# Patient Record
Sex: Male | Born: 1975 | Race: Black or African American | Hispanic: No | Marital: Single | Smoking: Current every day smoker
Health system: Southern US, Community
[De-identification: ages and names within clinical notes are randomized; demographics above are authoritative.]

## PROBLEM LIST (undated history)

## (undated) DIAGNOSIS — Z789 Other specified health status: Secondary | ICD-10-CM

## (undated) HISTORY — PX: NO PAST SURGERIES: SHX2092

---

## 2018-02-15 ENCOUNTER — Other Ambulatory Visit: Payer: Self-pay

## 2018-02-15 ENCOUNTER — Inpatient Hospital Stay
Admission: EM | Admit: 2018-02-15 | Discharge: 2018-02-17 | DRG: 935 | Disposition: A | Discharge status: Home / Self Care | Payer: Self-pay | Attending: Internal Medicine | Admitting: Internal Medicine

## 2018-02-15 ENCOUNTER — Emergency Department: Payer: Self-pay

## 2018-02-15 DIAGNOSIS — X19XXXA Contact with other heat and hot substances, initial encounter: Secondary | ICD-10-CM | POA: Diagnosis present

## 2018-02-15 DIAGNOSIS — T25229A Burn of second degree of unspecified foot, initial encounter: Secondary | ICD-10-CM | POA: Diagnosis present

## 2018-02-15 DIAGNOSIS — L0390 Cellulitis, unspecified: Secondary | ICD-10-CM | POA: Diagnosis present

## 2018-02-15 DIAGNOSIS — R030 Elevated blood-pressure reading, without diagnosis of hypertension: Secondary | ICD-10-CM | POA: Diagnosis present

## 2018-02-15 DIAGNOSIS — T25222A Burn of second degree of left foot, initial encounter: Principal | ICD-10-CM | POA: Diagnosis present

## 2018-02-15 DIAGNOSIS — L03116 Cellulitis of left lower limb: Secondary | ICD-10-CM | POA: Diagnosis present

## 2018-02-15 DIAGNOSIS — R7303 Prediabetes: Secondary | ICD-10-CM | POA: Diagnosis present

## 2018-02-15 DIAGNOSIS — F17210 Nicotine dependence, cigarettes, uncomplicated: Secondary | ICD-10-CM | POA: Diagnosis present

## 2018-02-15 HISTORY — DX: Other specified health status: Z78.9

## 2018-02-15 LAB — CBC WITH DIFFERENTIAL/PLATELET
BASOS ABS: 0.1 10*3/uL (ref 0–0.1)
BASOS PCT: 1 %
EOS ABS: 0 10*3/uL (ref 0–0.7)
Eosinophils Relative: 0 %
HEMATOCRIT: 44.7 % (ref 40.0–52.0)
HEMOGLOBIN: 15.3 g/dL (ref 13.0–18.0)
Lymphocytes Relative: 11 %
Lymphs Abs: 1.1 10*3/uL (ref 1.0–3.6)
MCH: 32.3 pg (ref 26.0–34.0)
MCHC: 34.2 g/dL (ref 32.0–36.0)
MCV: 94.5 fL (ref 80.0–100.0)
Monocytes Absolute: 1 10*3/uL (ref 0.2–1.0)
Monocytes Relative: 10 %
NEUTROS ABS: 7.8 10*3/uL — AB (ref 1.4–6.5)
NEUTROS PCT: 78 %
Platelets: 272 10*3/uL (ref 150–440)
RBC: 4.73 MIL/uL (ref 4.40–5.90)
RDW: 17.4 % — ABNORMAL HIGH (ref 11.5–14.5)
WBC: 10 10*3/uL (ref 3.8–10.6)

## 2018-02-15 LAB — COMPREHENSIVE METABOLIC PANEL
ALBUMIN: 4.4 g/dL (ref 3.5–5.0)
ALT: 40 U/L (ref 0–44)
AST: 34 U/L (ref 15–41)
Alkaline Phosphatase: 92 U/L (ref 38–126)
Anion gap: 11 (ref 5–15)
BILIRUBIN TOTAL: 0.5 mg/dL (ref 0.3–1.2)
BUN: 11 mg/dL (ref 6–20)
CALCIUM: 9.3 mg/dL (ref 8.9–10.3)
CO2: 25 mmol/L (ref 22–32)
Chloride: 104 mmol/L (ref 98–111)
Creatinine, Ser: 1.02 mg/dL (ref 0.61–1.24)
GLUCOSE: 118 mg/dL — AB (ref 70–99)
POTASSIUM: 3.5 mmol/L (ref 3.5–5.1)
Sodium: 140 mmol/L (ref 135–145)
TOTAL PROTEIN: 8.1 g/dL (ref 6.5–8.1)

## 2018-02-15 LAB — PROTIME-INR
INR: 0.92
PROTHROMBIN TIME: 12.3 s (ref 11.4–15.2)

## 2018-02-15 LAB — LACTIC ACID, PLASMA: LACTIC ACID, VENOUS: 1.6 mmol/L (ref 0.5–1.9)

## 2018-02-15 MED ORDER — VANCOMYCIN HCL IN DEXTROSE 1-5 GM/200ML-% IV SOLN
1000.0000 mg | Freq: Three times a day (TID) | INTRAVENOUS | Status: DC
  Administered 2018-02-16: 1000 mg via INTRAVENOUS
  Filled 2018-02-15 (×4): qty 200

## 2018-02-15 MED ORDER — SODIUM CHLORIDE 0.9 % IV BOLUS
1000.0000 mL | Freq: Once | INTRAVENOUS | Status: AC
  Administered 2018-02-15: 1000 mL via INTRAVENOUS

## 2018-02-15 MED ORDER — SODIUM CHLORIDE 0.9 % IV SOLN
2.0000 g | INTRAVENOUS | Status: DC
  Administered 2018-02-16: 06:00:00 2 g via INTRAVENOUS
  Filled 2018-02-15: qty 2

## 2018-02-15 MED ORDER — PIPERACILLIN-TAZOBACTAM 3.375 G IVPB 30 MIN
3.3750 g | Freq: Once | INTRAVENOUS | Status: AC
  Administered 2018-02-15: 3.375 g via INTRAVENOUS
  Filled 2018-02-15: qty 50

## 2018-02-15 MED ORDER — MORPHINE SULFATE (PF) 4 MG/ML IV SOLN
4.0000 mg | Freq: Once | INTRAVENOUS | Status: AC
  Administered 2018-02-15: 4 mg via INTRAVENOUS
  Filled 2018-02-15: qty 1

## 2018-02-15 MED ORDER — VANCOMYCIN HCL IN DEXTROSE 1-5 GM/200ML-% IV SOLN
1000.0000 mg | Freq: Once | INTRAVENOUS | Status: AC
  Administered 2018-02-15: 1000 mg via INTRAVENOUS
  Filled 2018-02-15: qty 200

## 2018-02-15 MED ORDER — ONDANSETRON HCL 4 MG/2ML IJ SOLN
4.0000 mg | Freq: Once | INTRAMUSCULAR | Status: AC
  Administered 2018-02-15: 4 mg via INTRAVENOUS
  Filled 2018-02-15: qty 2

## 2018-02-15 NOTE — Progress Notes (Signed)
Pharmacy Antibiotic Note  Gary Krause is a 42 y.o. male admitted on 02/15/2018 with foot ulcer/injury.  Pharmacy has been consulted for vanc/cefepime dosing. Patient received vanc 1g and zosyn 3.375g IV x 1 in ED  Plan: Will continue vanc 1g IV q8h  Will draw vanc trough 07/31 @ 0100 prior to 4th dose. Will start cefepime 2g IV q24h  Ke 0.103 T1/2 8 hrs Goal trough 15 - 20 mcg/mL  Height: 6' (182.9 cm) Weight: 230 lb (104.3 kg) IBW/kg (Calculated) : 77.6  Temp (24hrs), Avg:98.8 F (37.1 C), Min:98.8 F (37.1 C), Max:98.8 F (37.1 C)  Recent Labs  Lab 02/15/18 2019  WBC 10.0  CREATININE 1.02  LATICACIDVEN 1.6    Estimated Creatinine Clearance: 119 mL/min (by C-G formula based on SCr of 1.02 mg/dL).    No Known Allergies   Thank you for allowing pharmacy to be a part of this patient's care.  Gary Rippleavid Omni Krause, PharmD, BCPS Clinical Pharmacist 02/15/2018

## 2018-02-15 NOTE — ED Triage Notes (Signed)
Pt arrives to ED via POV from home with c/o skin ulcer to the left foot. Pt states he dropped hot coals on it last week from a BBQ grill and it has not healed and gotten infected. Pt denies h/x of DM. Pt denies fever, no co N/V/D, no CP or SHOB.

## 2018-02-15 NOTE — ED Provider Notes (Signed)
[facility] Emergency Department Provider Note  ____________________________________________  Time seen: Approximately 8:43 PM  I have reviewed the triage vital signs and the nursing notes.   HISTORY  Chief Complaint Foot Injury and Skin Ulcer   HPI Gary Krause is a 42 y.o. male with no significant past medical history who presents for concerns of infection and pain on his left foot.  Patient reports that a week ago he dropped a hot coals from a barbecue onto his foot.  He sustained a burn and form a very large blister.  He reports that the day after he popped the blister.  He reports that his pain was minimal and he thought it would heal on its own.  Since yesterday the pain has become severe and is currently 10 out of 10, sharp, constant and nonradiating.  He also has had worsening swelling of the foot.  No fever, chills, nausea, vomiting.  Patient is not a diabetic.  PMH None - reviewed  Allergies Patient has no known allergies.  No family history on file.  Social History Social History   Tobacco Use  . Smoking status: Current Every Day Smoker  . Smokeless tobacco: Never Used  Substance Use Topics  . Alcohol use: Not on file  . Drug use: Not on file    Review of Systems  Constitutional: Negative for fever. Eyes: Negative for visual changes. ENT: Negative for sore throat. Neck: No neck pain  Cardiovascular: Negative for chest pain. Respiratory: Negative for shortness of breath. Gastrointestinal: Negative for abdominal pain, vomiting or diarrhea. Genitourinary: Negative for dysuria. Musculoskeletal: Negative for back pain. + L foot burn and pain Skin: Negative for rash. Neurological: Negative for headaches, weakness or numbness. Psych: No SI or HI  ____________________________________________   PHYSICAL EXAM:  VITAL SIGNS: ED Triage Vitals  Enc Vitals Group     BP 02/15/18 2002 (!) 153/91     Pulse Rate 02/15/18 2002 (!) 120    Resp 02/15/18 2002 17     Temp 02/15/18 2002 98.8 F (37.1 C)     Temp Source 02/15/18 2002 Oral     SpO2 02/15/18 2002 96 %     Weight 02/15/18 2003 230 lb (104.3 kg)     Height 02/15/18 2003 6' (1.829 m)     Head Circumference --      Peak Flow --      Pain Score 02/15/18 2002 9     Pain Loc --      Pain Edu? --      Excl. in GC? --     Constitutional: Alert and oriented. Well appearing and in no apparent distress. HEENT:      Head: Normocephalic and atraumatic.         Eyes: Conjunctivae are normal. Sclera is non-icteric.       Mouth/Throat: Mucous membranes are moist.       Neck: Supple with no signs of meningismus. Cardiovascular: Tachycardic with regular rhythm. No murmurs, gallops, or rubs. 2+ symmetrical distal pulses are present in all extremities. No JVD. Respiratory: Normal respiratory effort. Lungs are clear to auscultation bilaterally. No wheezes, crackles, or rhonchi.  Gastrointestinal: Soft, non tender, and non distended with positive bowel sounds. No rebound or guarding. Musculoskeletal: Large 2nd degree burn located in the lateral aspect of the L foot with surrounding warmth and erythema, and swelling of the L foot.  Neurologic: Normal speech and language. Face is symmetric. Moving all extremities. No gross focal neurologic deficits  are appreciated. Skin: Skin is warm, dry and intact. No rash noted. Psychiatric: Mood and affect are normal. Speech and behavior are normal.  ____________________________________________   LABS (all labs ordered are listed, but only abnormal results are displayed)  Labs Reviewed  COMPREHENSIVE METABOLIC PANEL - Abnormal; Notable for the following components:      Result Value   Glucose, Bld 118 (*)    All other components within normal limits  CBC WITH DIFFERENTIAL/PLATELET - Abnormal; Notable for the following components:   RDW 17.4 (*)    Neutro Abs 7.8 (*)    All other components within normal limits  CULTURE, BLOOD (ROUTINE  X 2)  CULTURE, BLOOD (ROUTINE X 2)  LACTIC ACID, PLASMA  PROTIME-INR  LACTIC ACID, PLASMA  URINALYSIS, COMPLETE (UACMP) WITH MICROSCOPIC   ____________________________________________  EKG  ED ECG REPORT I, Nita Sicklearolina Susy Placzek, the attending physician, personally viewed and interpreted this ECG.  Sinus tachycardia, rate of 108, normal intervals, normal axis, no ST elevations or depressions, T wave inversions in inferior leads. No prior for comparison ____________________________________________  RADIOLOGY  I have personally reviewed the images performed during this visit and I agree with the Radiologist's read.   Interpretation by Radiologist:  Dg Foot Complete Left  Result Date: 02/15/2018 CLINICAL DATA:  Skin ulcer to the left foot EXAM: LEFT FOOT - COMPLETE 3+ VIEW COMPARISON:  None. FINDINGS: No fracture or malalignment. No soft tissue gas. No periostitis or bone destruction. No radiopaque foreign body IMPRESSION: No acute osseous abnormality Electronically Signed   By: Jasmine PangKim  Fujinaga M.D.   On: 02/15/2018 20:48   ____________________________________________   PROCEDURES  Procedure(s) performed: None Procedures Critical Care performed:  None ____________________________________________   INITIAL IMPRESSION / ASSESSMENT AND PLAN / ED COURSE  42 y.o. male with no significant past medical history who presents for concerns of infection and pain on his left foot.  Patient presents with a second-degree burn that he sustained a week ago located on the lateral aspect of his left foot with overlying cellulitis and swelling.  Patient is tachycardic but afebrile.  There is no crepitus palpable on exam.  Will start patient on Zosyn and bank, morphine for pain.  Blood cultures have been sent.  Labs are pending.  X-rays pending.  Anticipate admission     _________________________ 9:31 PM on 02/15/2018 -----------------------------------------  Labs showing normal lactic and WBC. Due  to superinfected wound, need for urgent wound care, and significant pain, patient will be admitted to the Hospitalist service.    As part of my medical decision making, I reviewed the following data within the electronic MEDICAL RECORD NUMBER Nursing notes reviewed and incorporated, Labs reviewed , Old chart reviewed, Radiograph reviewed , Notes from prior ED visits and [place] Controlled Substance Database    Pertinent labs & imaging results that were available during my care of the patient were reviewed by me and considered in my medical decision making (see chart for details).    ____________________________________________   FINAL CLINICAL IMPRESSION(S) / ED DIAGNOSES  Final diagnoses:  Burn, foot, second degree, left, initial encounter  Cellulitis of left lower extremity      NEW MEDICATIONS STARTED DURING THIS VISIT:  ED Discharge Orders    None       Note:  This document was prepared using Dragon voice recognition software and may include unintentional dictation errors.    Don PerkingVeronese, [place], MD 02/15/18 2132

## 2018-02-15 NOTE — H&P (Signed)
[facility] [place] at [facility]   PATIENT NAME: Gary Krause    MR#:  [id number]  DATE OF BIRTH:  1976/02/05  DATE OF ADMISSION:  02/15/2018  PRIMARY CARE PHYSICIAN: Patient, No Pcp Per   REQUESTING/REFERRING PHYSICIAN: Don PerkingVeronese, MD  CHIEF COMPLAINT:   Chief Complaint  Patient presents with  . Foot Injury  . Skin Ulcer    HISTORY OF PRESENT ILLNESS:  Gary DoffingDennis Milham  is a 42 y.o. male who presents with chief complaint as above.  Patient was grilling and dropped hot coal onto his foot several days ago.  He developed a blister and second-degree burn on that left foot.  He popped the blister and has been using peroxide and Neosporin on the wound since then.  He has been trying to keep it dressed and covered, but he still developed swelling and pain in that foot.  Here in the ED he has clear cellulitis in the foot, and an open wound that is deep to subcu fat and muscle.  Hospitalist were called for admission  PAST MEDICAL HISTORY:   Past Medical History:  Diagnosis Date  . Patient denies medical problems      PAST SURGICAL HISTORY:   Past Surgical History:  Procedure Laterality Date  . NO PAST SURGERIES       SOCIAL HISTORY:   Social History   Tobacco Use  . Smoking status: Current Every Day Smoker  . Smokeless tobacco: Never Used  Substance Use Topics  . Alcohol use: Not Currently     FAMILY HISTORY:  Family history reviewed and is noncontributory   DRUG ALLERGIES:  No Known Allergies  MEDICATIONS AT HOME:   Prior to Admission medications   Not on File    REVIEW OF SYSTEMS:  Review of Systems  Constitutional: Negative for chills, fever, malaise/fatigue and weight loss.  HENT: Negative for ear pain, hearing loss and tinnitus.   Eyes: Negative for blurred vision, double vision, pain and redness.  Respiratory: Negative for cough, hemoptysis and shortness of breath.   Cardiovascular: Negative for chest pain, palpitations,  orthopnea and leg swelling.  Gastrointestinal: Negative for abdominal pain, constipation, diarrhea, nausea and vomiting.  Genitourinary: Negative for dysuria, frequency and hematuria.  Musculoskeletal: Negative for back pain, joint pain and neck pain.  Skin:       Lateral left foot wound with surrounding cellulitis and edema  Neurological: Negative for dizziness, tremors, focal weakness and weakness.  Endo/Heme/Allergies: Negative for polydipsia. Does not bruise/bleed easily.  Psychiatric/Behavioral: Negative for depression. The patient is not nervous/anxious and does not have insomnia.      VITAL SIGNS:   Vitals:   02/15/18 2019 02/15/18 2030 02/15/18 2100 02/15/18 2130  BP:  (!) 158/100 (!) 144/91 125/80  Pulse: (!) 106 (!) 105 (!) 104 (!) 111  Resp:   15 17  Temp:      TempSrc:      SpO2: 98% 96% 96% 94%  Weight:      Height:       Wt Readings from Last 3 Encounters:  02/15/18 104.3 kg (230 lb)    PHYSICAL EXAMINATION:  Physical Exam  Vitals reviewed. Constitutional: He is oriented to person, place, and time. He appears well-developed and well-nourished. No distress.  HENT:  Head: Normocephalic and atraumatic.  Mouth/Throat: Oropharynx is clear and moist.  Eyes: Pupils are equal, round, and reactive to light. Conjunctivae and EOM are normal. No scleral icterus.  Neck: Normal range of motion. Neck supple.  No JVD present. No thyromegaly present.  Cardiovascular: Normal rate, regular rhythm and intact distal pulses. Exam reveals no gallop and no friction rub.  No murmur heard. Respiratory: Effort normal and breath sounds normal. No respiratory distress. He has no wheezes. He has no rales.  GI: Soft. Bowel sounds are normal. He exhibits no distension. There is no tenderness.  Musculoskeletal: Normal range of motion. He exhibits no edema.  No arthritis, no gout  Lymphadenopathy:    He has no cervical adenopathy.  Neurological: He is alert and oriented to person, place, and  time. No cranial nerve deficit.  No dysarthria, no aphasia  Skin: Skin is warm and dry. No rash noted. There is erythema (Left foot surrounding lateral wound).  Psychiatric: He has a normal mood and affect. His behavior is normal. Judgment and thought content normal.    LABORATORY PANEL:   CBC Recent Labs  Lab 02/15/18 2019  WBC 10.0  HGB 15.3  HCT 44.7  PLT 272   ------------------------------------------------------------------------------------------------------------------  Chemistries  Recent Labs  Lab 02/15/18 2019  NA 140  K 3.5  CL 104  CO2 25  GLUCOSE 118*  BUN 11  CREATININE 1.02  CALCIUM 9.3  AST 34  ALT 40  ALKPHOS 92  BILITOT 0.5   ------------------------------------------------------------------------------------------------------------------  Cardiac Enzymes No results for input(s): TROPONINI in the last 168 hours. ------------------------------------------------------------------------------------------------------------------  RADIOLOGY:  Dg Foot Complete Left  Result Date: 02/15/2018 CLINICAL DATA:  Skin ulcer to the left foot EXAM: LEFT FOOT - COMPLETE 3+ VIEW COMPARISON:  None. FINDINGS: No fracture or malalignment. No soft tissue gas. No periostitis or bone destruction. No radiopaque foreign body IMPRESSION: No acute osseous abnormality Electronically Signed   By: Jasmine Pang M.D.   On: 02/15/2018 20:48    EKG:   Orders placed or performed during the hospital encounter of 02/15/18  . ED EKG  . ED EKG  . EKG 12-Lead  . EKG 12-Lead    IMPRESSION AND PLAN:  Principal Problem:   Cellulitis -broad IV antibiotics, podiatry and wound team consults Active Problems:   Burn, foot, second degree -IV antibiotics and consults as above   Chart review performed and case discussed with ED provider. Labs, imaging and/or ECG reviewed by provider and discussed with patient/family. Management plans discussed with the patient and/or family.  DVT  PROPHYLAXIS: SubQ lovenox   GI PROPHYLAXIS:  PPI   ADMISSION STATUS: Inpatient     CODE STATUS: Full  TOTAL TIME TAKING CARE OF THIS PATIENT: 45 minutes.   Justice Milliron FIELDING 02/15/2018, 10:20 PM  [facility]  Office  [phone]  CC: Primary care physician; Patient, No Pcp Per  Note:  This document was prepared using Dragon voice recognition software and may include unintentional dictation errors.

## 2018-02-16 ENCOUNTER — Other Ambulatory Visit: Payer: Self-pay

## 2018-02-16 LAB — URINALYSIS, COMPLETE (UACMP) WITH MICROSCOPIC
Bacteria, UA: NONE SEEN
Bilirubin Urine: NEGATIVE
Glucose, UA: NEGATIVE mg/dL
KETONES UR: NEGATIVE mg/dL
Nitrite: NEGATIVE
PROTEIN: NEGATIVE mg/dL
Specific Gravity, Urine: 1.017 (ref 1.005–1.030)
pH: 5 (ref 5.0–8.0)

## 2018-02-16 LAB — BASIC METABOLIC PANEL
Anion gap: 9 (ref 5–15)
BUN: 9 mg/dL (ref 6–20)
CO2: 26 mmol/L (ref 22–32)
Calcium: 8.7 mg/dL — ABNORMAL LOW (ref 8.9–10.3)
Chloride: 104 mmol/L (ref 98–111)
Creatinine, Ser: 0.88 mg/dL (ref 0.61–1.24)
GFR calc Af Amer: >60 mL/min (ref >60)
GFR calc non Af Amer: >60 mL/min (ref >60)
Glucose, Bld: 129 mg/dL — ABNORMAL HIGH (ref 70–99)
Potassium: 3.6 mmol/L (ref 3.5–5.1)
SODIUM: 139 mmol/L (ref 135–145)

## 2018-02-16 LAB — CBC
HEMATOCRIT: 44.1 % (ref 40.0–52.0)
Hemoglobin: 14.8 g/dL (ref 13.0–18.0)
MCH: 32 pg (ref 26.0–34.0)
MCHC: 33.6 g/dL (ref 32.0–36.0)
MCV: 95.3 fL (ref 80.0–100.0)
Platelets: 250 10*3/uL (ref 150–440)
RBC: 4.63 MIL/uL (ref 4.40–5.90)
RDW: 17.6 % — AB (ref 11.5–14.5)
WBC: 8.2 10*3/uL (ref 3.8–10.6)

## 2018-02-16 LAB — HEMOGLOBIN A1C
Hgb A1c MFr Bld: 6 % — ABNORMAL HIGH (ref 4.8–5.6)
Mean Plasma Glucose: 125.5 mg/dL

## 2018-02-16 MED ORDER — SILVER SULFADIAZINE 1 % EX CREA
TOPICAL_CREAM | Freq: Every day | CUTANEOUS | Status: DC
  Administered 2018-02-16: 11:00:00 via TOPICAL
  Administered 2018-02-17: 1 via TOPICAL
  Filled 2018-02-16: qty 85

## 2018-02-16 MED ORDER — ACETAMINOPHEN 650 MG RE SUPP: 650.0000 mg | Freq: Four times a day (QID) | RECTAL | Status: DC | PRN

## 2018-02-16 MED ORDER — OXYCODONE HCL 5 MG PO TABS
5.0000 mg | ORAL_TABLET | ORAL | Status: DC | PRN
  Administered 2018-02-16 – 2018-02-17 (×6): 5 mg via ORAL
  Filled 2018-02-16 (×6): qty 1

## 2018-02-16 MED ORDER — MORPHINE SULFATE (PF) 4 MG/ML IV SOLN
4.0000 mg | INTRAVENOUS | Status: DC | PRN
  Administered 2018-02-16 (×3): 4 mg via INTRAVENOUS
  Filled 2018-02-16 (×3): qty 1

## 2018-02-16 MED ORDER — HYDRALAZINE HCL 20 MG/ML IJ SOLN: 10.0000 mg | Freq: Four times a day (QID) | INTRAMUSCULAR | Status: DC | PRN

## 2018-02-16 MED ORDER — ONDANSETRON HCL 4 MG PO TABS: 4.0000 mg | ORAL_TABLET | Freq: Four times a day (QID) | ORAL | Status: DC | PRN

## 2018-02-16 MED ORDER — ONDANSETRON HCL 4 MG/2ML IJ SOLN
4.0000 mg | Freq: Four times a day (QID) | INTRAMUSCULAR | Status: DC | PRN
Start: 2018-02-16 — End: 2018-02-17

## 2018-02-16 MED ORDER — CEFAZOLIN SODIUM-DEXTROSE 1-4 GM/50ML-% IV SOLN
1.0000 g | Freq: Three times a day (TID) | INTRAVENOUS | Status: DC
  Administered 2018-02-16 – 2018-02-17 (×3): 1 g via INTRAVENOUS
  Filled 2018-02-16 (×6): qty 50

## 2018-02-16 MED ORDER — ACETAMINOPHEN 325 MG PO TABS: 650.0000 mg | ORAL_TABLET | Freq: Four times a day (QID) | ORAL | Status: DC | PRN

## 2018-02-16 MED ORDER — ENOXAPARIN SODIUM 40 MG/0.4ML ~~LOC~~ SOLN
40.0000 mg | SUBCUTANEOUS | Status: DC
  Administered 2018-02-16 – 2018-02-17 (×2): 40 mg via SUBCUTANEOUS
  Filled 2018-02-16 (×2): qty 0.4

## 2018-02-16 MED ORDER — SENNOSIDES-DOCUSATE SODIUM 8.6-50 MG PO TABS
1.0000 | ORAL_TABLET | Freq: Every day | ORAL | Status: DC
  Administered 2018-02-16: 22:00:00 1 via ORAL
  Filled 2018-02-16: qty 1

## 2018-02-16 MED ORDER — SODIUM CHLORIDE 0.9 % IV SOLN
2.0000 g | Freq: Two times a day (BID) | INTRAVENOUS | Status: DC
  Filled 2018-02-16: qty 2

## 2018-02-16 MED ORDER — DIPHENHYDRAMINE HCL 50 MG/ML IJ SOLN
12.5000 mg | Freq: Once | INTRAMUSCULAR | Status: AC
  Administered 2018-02-16: 12.5 mg via INTRAVENOUS
  Filled 2018-02-16: qty 0.25

## 2018-02-16 NOTE — Progress Notes (Signed)
Pharmacy Antibiotic Note  Gary DoffingDennis Krause is a 42 y.o. male admitted on 02/15/2018 with foot ulcer/injury.  Pharmacy has been consulted for vanc/cefepime dosing. Patient received vanc 1g and zosyn 3.375g IV x 1 in ED  Plan: Will continue vanc 1g IV q8h  Vanc trough ordered 07/31 @ 0100 prior to 4th dose. Will start cefepime 2g IV q12h  Ke 0.103 T1/2 8 hrs Goal trough 15 - 20 mcg/mL  Height: 6' (182.9 cm) Weight: 230 lb (104.3 kg) IBW/kg (Calculated) : 77.6  Temp (24hrs), Avg:98.5 F (36.9 C), Min:98.2 F (36.8 C), Max:98.8 F (37.1 C)  Recent Labs  Lab 02/15/18 2019 02/16/18 0354  WBC 10.0 8.2  CREATININE 1.02 0.88  LATICACIDVEN 1.6  --     Estimated Creatinine Clearance: 138 mL/min (by C-G formula based on SCr of 0.88 mg/dL).    No Known Allergies   Thank you for allowing pharmacy to be a part of this patient's care.  Gardner CandleSheema M Shigeko Manard, PharmD, BCPS Clinical Pharmacist 02/16/2018 8:46 AM

## 2018-02-16 NOTE — Consult Note (Addendum)
Reason for Consult: Second-degree burn left foot Referring Physician: Reily Ilic is an 42 y.o. male.  HPI: This is a 42 year old male with a history of a burn on his left foot that happened a couple of weeks ago when he was grilling and the coals fell out onto his left foot.  States a blister formed and this did stay in tact for over a week but then he burst the blister and started to have some significant pain in the left foot.  Presented to the emergency department for evaluation and was hospitalized for antibiotics and treatment.  Past Medical History:  Diagnosis Date  . Patient denies medical problems     Past Surgical History:  Procedure Laterality Date  . NO PAST SURGERIES      History reviewed. No pertinent family history.  Social History:  reports that he has been smoking.  He has been smoking about 0.25 packs per day. He has never used smokeless tobacco. He reports that he drank alcohol. He reports that he has current or past drug history.  Allergies: No Known Allergies  Medications: Scheduled: . enoxaparin (LOVENOX) injection  40 mg Subcutaneous Q24H  . senna-docusate  1 tablet Oral QHS  . silver sulfADIAZINE   Topical Daily    Results for orders placed or performed during the hospital encounter of 02/15/18 (from the past 48 hour(s))  Comprehensive metabolic panel     Status: Abnormal   Collection Time: 02/15/18  8:19 PM  Result Value Ref Range   Sodium 140 135 - 145 mmol/L   Potassium 3.5 3.5 - 5.1 mmol/L   Chloride 104 98 - 111 mmol/L   CO2 25 22 - 32 mmol/L   Glucose, Bld 118 (H) 70 - 99 mg/dL   BUN 11 6 - 20 mg/dL   Creatinine, Ser 1.02 0.61 - 1.24 mg/dL   Calcium 9.3 8.9 - 10.3 mg/dL   Total Protein 8.1 6.5 - 8.1 g/dL   Albumin 4.4 3.5 - 5.0 g/dL   AST 34 15 - 41 U/L   ALT 40 0 - 44 U/L   Alkaline Phosphatase 92 38 - 126 U/L   Total Bilirubin 0.5 0.3 - 1.2 mg/dL   GFR calc non Af Amer >60 >60 mL/min   GFR calc Af Amer >60 >60 mL/min   Comment: (NOTE) The eGFR has been calculated using the CKD EPI equation. This calculation has not been validated in all clinical situations. eGFR's persistently <60 mL/min signify possible Chronic Kidney Disease.    Anion gap 11 5 - 15    Comment: Performed at [facility], [address] [place], [place] [address]  Lactic acid, plasma     Status: None   Collection Time: 02/15/18  8:19 PM  Result Value Ref Range   Lactic Acid, Venous 1.6 0.5 - 1.9 mmol/L    Comment: Performed at [facility], [address] [place], [place] [address]  CBC with Differential     Status: Abnormal   Collection Time: 02/15/18  8:19 PM  Result Value Ref Range   WBC 10.0 3.8 - 10.6 K/uL   RBC 4.73 4.40 - 5.90 MIL/uL   Hemoglobin 15.3 13.0 - 18.0 g/dL   HCT 44.7 40.0 - 52.0 %   MCV 94.5 80.0 - 100.0 fL   MCH 32.3 26.0 - 34.0 pg   MCHC 34.2 32.0 - 36.0 g/dL   RDW 17.4 (H) 11.5 - 14.5 %   Platelets 272 150 - 440 K/uL  Neutrophils Relative % 78 %   Neutro Abs 7.8 (H) 1.4 - 6.5 K/uL   Lymphocytes Relative 11 %   Lymphs Abs 1.1 1.0 - 3.6 K/uL   Monocytes Relative 10 %   Monocytes Absolute 1.0 0.2 - 1.0 K/uL   Eosinophils Relative 0 %   Eosinophils Absolute 0.0 0 - 0.7 K/uL   Basophils Relative 1 %   Basophils Absolute 0.1 0 - 0.1 K/uL    Comment: Performed at [facility], [address] [place], [place] [address]  Protime-INR     Status: None   Collection Time: 02/15/18  8:19 PM  Result Value Ref Range   Prothrombin Time 12.3 11.4 - 15.2 seconds   INR 0.92     Comment: Performed at [facility], [address] [place], [place] [address]  Culture, blood (Routine x 2)     Status: None (Preliminary result)   Collection Time: 02/15/18  8:19 PM  Result Value Ref Range   Specimen Description BLOOD RIGHT ANTECUBITAL    Special Requests      Blood Culture results may not be optimal due to an inadequate volume of blood received in culture bottles   Culture       NO GROWTH < 12 HOURS Performed at [facility], [address] [place], [place] [address]    Report Status PENDING   Culture, blood (Routine x 2)     Status: None (Preliminary result)   Collection Time: 02/15/18  8:19 PM  Result Value Ref Range   Specimen Description BLOOD BLOOD LEFT WRIST    Special Requests      Blood Culture results may not be optimal due to an inadequate volume of blood received in culture bottles   Culture      NO GROWTH < 12 HOURS Performed at [facility], [address] [place], [place] [address]    Report Status PENDING   Basic metabolic panel     Status: Abnormal   Collection Time: 02/16/18  3:54 AM  Result Value Ref Range   Sodium 139 135 - 145 mmol/L   Potassium 3.6 3.5 - 5.1 mmol/L   Chloride 104 98 - 111 mmol/L   CO2 26 22 - 32 mmol/L   Glucose, Bld 129 (H) 70 - 99 mg/dL   BUN 9 6 - 20 mg/dL   Creatinine, Ser 0.88 0.61 - 1.24 mg/dL   Calcium 8.7 (L) 8.9 - 10.3 mg/dL   GFR calc non Af Amer >60 >60 mL/min   GFR calc Af Amer >60 >60 mL/min    Comment: (NOTE) The eGFR has been calculated using the CKD EPI equation. This calculation has not been validated in all clinical situations. eGFR's persistently <60 mL/min signify possible Chronic Kidney Disease.    Anion gap 9 5 - 15    Comment: Performed at [facility], [address] [place],  [address]  CBC     Status: Abnormal   Collection Time: 02/16/18  3:54 AM  Result Value Ref Range   WBC 8.2 3.8 - 10.6 K/uL   RBC 4.63 4.40 - 5.90 MIL/uL   Hemoglobin 14.8 13.0 - 18.0 g/dL   HCT 44.1 40.0 - 52.0 %   MCV 95.3 80.0 - 100.0 fL   MCH 32.0 26.0 - 34.0 pg   MCHC 33.6 32.0 - 36.0 g/dL   RDW 17.6 (H) 11.5 - 14.5 %   Platelets 250 150 - 440 K/uL    Comment: Performed  at [facility], [address] [place], [place] [address]  Urinalysis, Complete w Microscopic     Status: Abnormal   Collection Time: 02/16/18  4:32 AM  Result Value Ref Range   Color,  Urine YELLOW (A) YELLOW   APPearance CLEAR (A) CLEAR   Specific Gravity, Urine 1.017 1.005 - 1.030   pH 5.0 5.0 - 8.0   Glucose, UA NEGATIVE NEGATIVE mg/dL   Hgb urine dipstick LARGE (A) NEGATIVE   Bilirubin Urine NEGATIVE NEGATIVE   Ketones, ur NEGATIVE NEGATIVE mg/dL   Protein, ur NEGATIVE NEGATIVE mg/dL   Nitrite NEGATIVE NEGATIVE   Leukocytes, UA TRACE (A) NEGATIVE   RBC / HPF 0-5 0 - 5 RBC/hpf   WBC, UA 21-50 0 - 5 WBC/hpf   Bacteria, UA NONE SEEN NONE SEEN   Squamous Epithelial / LPF 0-5 0 - 5    Comment: Performed at [facility], [address] [place], [place] [address]    Dg Foot Complete Left  Result Date: 02/15/2018 CLINICAL DATA:  Skin ulcer to the left foot EXAM: LEFT FOOT - COMPLETE 3+ VIEW COMPARISON:  None. FINDINGS: No fracture or malalignment. No soft tissue gas. No periostitis or bone destruction. No radiopaque foreign body IMPRESSION: No acute osseous abnormality Electronically Signed   By: Donavan Foil M.D.   On: 02/15/2018 20:48    Review of Systems  Constitutional: Negative for chills and fever.  HENT: Negative.   Eyes: Negative.   Respiratory: Negative.   Cardiovascular: Negative.   Gastrointestinal: Negative for nausea and vomiting.  Genitourinary: Negative.   Musculoskeletal:       Pain in the left foot  Skin:       Blister on the left foot from a burn 2 weeks ago, recently open this up and has been draining.  Some swelling and redness in the foot  Neurological: Negative.   Endo/Heme/Allergies: Negative.   Psychiatric/Behavioral: Negative.    Blood pressure (!) 133/93, pulse 86, temperature 98.2 F (36.8 C), temperature source Oral, resp. rate 18, height 6' (1.829 m), weight 104.3 kg (230 lb), SpO2 97 %. Physical Exam  Cardiovascular:  DP and PT pulses are palpable.  Musculoskeletal:  Guarded range of motion in the left foot.  Muscle testing is deferred.  Pain on palpation around the lateral aspect of the foot at his ulceration  sites.  Neurological:  Epicritic sensations appear grossly intact bilateral.  Skin:  Some erythema and edema in the left foot.  Full-thickness ulceration on the lateral aspect of the left foot measuring a proximally 7 mm x 3 mm x 0.3 mm depth with a smaller lesion over the heel proximately 1 cm x 1 cm x 0.2 cm depth mostly red granular base associated with these.  No focal areas of expressible purulence.   Assessment/Plan: Assessment: Second-degree burn left foot  Plan: Recommend continuing with Silvadene dressing changes daily as recommended by the wound care nurse.  I did debride some superficial skin slough from the blister area along the inferior aspect of the ulceration on the lateral left foot.  The debridement was only superficial and performed sharply and excisionally using a 15 blade and measured approximately 3 cm x 1 mm at the widest.  At this point would think he should be stable for discharge on oral antibiotics in the near future with follow-up outpatient in a couple of weeks either at the wound care center or our office.  Durward Fortes 02/16/2018, 1:04 PM
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
## 2018-02-16 NOTE — Plan of Care (Signed)
  Problem: Education: Goal: Knowledge of General Education information will improve Description: Including pain rating scale, medication(s)/side effects and non-pharmacologic comfort measures Outcome: Progressing   Problem: Clinical Measurements: Goal: Ability to avoid or minimize complications of infection will improve Outcome: Progressing   Problem: Skin Integrity: Goal: Skin integrity will improve Outcome: Progressing   

## 2018-02-16 NOTE — Progress Notes (Signed)
[facility] - [place] at [facility]   PATIENT NAME: Gary Krause    MR#:  [id number]  DATE OF BIRTH:  09-03-1975  SUBJECTIVE:  CHIEF COMPLAINT:   Chief Complaint  Patient presents with  . Foot Injury  . Skin Ulcer   Continues to have pain left foot with swelling.  No discharge.  REVIEW OF SYSTEMS:    Review of Systems  Constitutional: Positive for chills. Negative for fever.  HENT: Negative for sore throat.   Eyes: Negative for blurred vision, double vision and pain.  Respiratory: Negative for cough, hemoptysis, shortness of breath and wheezing.   Cardiovascular: Negative for chest pain, palpitations, orthopnea and leg swelling.  Gastrointestinal: Negative for abdominal pain, constipation, diarrhea, heartburn, nausea and vomiting.  Genitourinary: Negative for dysuria and hematuria.  Musculoskeletal: Positive for joint pain. Negative for back pain.  Skin: Negative for rash.  Neurological: Negative for sensory change, speech change, focal weakness and headaches.  Endo/Heme/Allergies: Does not bruise/bleed easily.  Psychiatric/Behavioral: Negative for depression. The patient is not nervous/anxious.     DRUG ALLERGIES:  No Known Allergies  VITALS:  Blood pressure (!) 133/93, pulse 86, temperature 98.2 F (36.8 C), temperature source Oral, resp. rate 18, height 6' (1.829 m), weight 104.3 kg (230 lb), SpO2 97 %.  PHYSICAL EXAMINATION:   Physical Exam  GENERAL:  42 y.o.-year-old patient lying in the bed with no acute distress.  EYES: Pupils equal, round, reactive to light and accommodation. No scleral icterus. Extraocular muscles intact.  HEENT: Head atraumatic, normocephalic. Oropharynx and nasopharynx clear.  NECK:  Supple, no jugular venous distention. No thyroid enlargement, no tenderness.  LUNGS: Normal breath sounds bilaterally, no wheezing, rales, rhonchi. No use of accessory muscles of respiration.  CARDIOVASCULAR: S1, S2 normal. No murmurs, rubs, or  gallops.  ABDOMEN: Soft, nontender, nondistended. Bowel sounds present. No organomegaly or mass.  EXTREMITIES: No cyanosis, clubbing or edema b/l.    NEUROLOGIC: Cranial nerves II through XII are intact. No focal Motor or sensory deficits b/l.   PSYCHIATRIC: The patient is alert and oriented x 3.  SKIN: Left foot laterally with large linear ulceration with clean edges. LABORATORY PANEL:   CBC Recent Labs  Lab 02/16/18 0354  WBC 8.2  HGB 14.8  HCT 44.1  PLT 250   ------------------------------------------------------------------------------------------------------------------ Chemistries  Recent Labs  Lab 02/15/18 2019 02/16/18 0354  NA 140 139  K 3.5 3.6  CL 104 104  CO2 25 26  GLUCOSE 118* 129*  BUN 11 9  CREATININE 1.02 0.88  CALCIUM 9.3 8.7*  AST 34  --   ALT 40  --   ALKPHOS 92  --   BILITOT 0.5  --    ------------------------------------------------------------------------------------------------------------------  Cardiac Enzymes No results for input(s): TROPONINI in the last 168 hours. ------------------------------------------------------------------------------------------------------------------  RADIOLOGY:  Dg Foot Complete Left  Result Date: 02/15/2018 CLINICAL DATA:  Skin ulcer to the left foot EXAM: LEFT FOOT - COMPLETE 3+ VIEW COMPARISON:  None. FINDINGS: No fracture or malalignment. No soft tissue gas. No periostitis or bone destruction. No radiopaque foreign body IMPRESSION: No acute osseous abnormality Electronically Signed   By: Jasmine PangKim  Fujinaga M.D.   On: 02/15/2018 20:48     ASSESSMENT AND PLAN:   * Left foot cellulitis with lateral foot clean ulcer -started with burn wound We will stop IV vancomycin and cefepime.  Start IV Ancef. Podiatry consult pending.  *Hyperglycemia.  Check hemoglobin A1c.  No diagnosis of diabetes mellitus.  Could be stress reaction with  infection.  *Elevated blood pressure without diagnosis of hypertension.  Monitor.   Start medications if consistently elevated.  DVT prophylaxis with Lovenox   All the records are reviewed and case discussed with Care Management/Social Workerr. Management plans discussed with the patient, family and they are in agreement.  CODE STATUS: FULL CODE  TOTAL TIME TAKING CARE OF THIS PATIENT: 30 minutes.   POSSIBLE D/C IN 1-2 DAYS, DEPENDING ON CLINICAL CONDITION.  Molinda Bailiff Gary Krause M.D on 02/16/2018 at 9:12 AM  Between 7am to 6pm - Pager - [phone]  After 6pm go to [email or web address] - password [email or web address]  [facility] [place] [facility]  Office  [phone]  CC: Primary care physician; Patient, No Pcp Per  Note: This dictation was prepared with Dragon dictation along with smaller phrase technology. Any transcriptional errors that result from this process are unintentional.

## 2018-02-17 LAB — HIV ANTIBODY (ROUTINE TESTING W REFLEX): HIV Screen 4th Generation wRfx: NONREACTIVE

## 2018-02-17 MED ORDER — SULFAMETHOXAZOLE-TRIMETHOPRIM 800-160 MG PO TABS: 1.0000 | ORAL_TABLET | Freq: Two times a day (BID) | ORAL | 0 refills | Status: AC

## 2018-02-17 MED ORDER — TRAMADOL HCL 50 MG PO TABS: 50.0000 mg | ORAL_TABLET | Freq: Four times a day (QID) | ORAL | 0 refills | Status: AC | PRN

## 2018-02-17 MED ORDER — SILVER SULFADIAZINE 1 % EX CREA: TOPICAL_CREAM | Freq: Every day | CUTANEOUS | 0 refills | Status: AC
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
## 2018-02-17 NOTE — Care Management (Signed)
Admitted to [facility] with the diagnosis of [place]ulitis. [place] No primary care physician. [facility] Will get his medications from [facility]. Duel application given Will sent referral to [facility]. Telephone # [phone] Discharge to home today per Dr. Nemiah CommanderKalisetti. Gwenette GreetBrenda S Lorenia Hoston RN MSN CCM Care Management [phone]

## 2018-02-17 NOTE — Plan of Care (Signed)
  Problem: Education: Goal: Knowledge of General Education information will improve Description Including pain rating scale, medication(s)/side effects and non-pharmacologic comfort measures Outcome: Progressing   Problem: Health Behavior/Discharge Planning: Goal: Ability to manage health-related needs will improve Outcome: Progressing   Problem: Clinical Measurements: Goal: Ability to avoid or minimize complications of infection will improve Outcome: Progressing   Problem: Skin Integrity: Goal: Skin integrity will improve Outcome: Progressing   

## 2018-02-20 LAB — CULTURE, BLOOD (ROUTINE X 2)
CULTURE: NO GROWTH
Culture: NO GROWTH

## 2020-01-31 IMAGING — DX DG FOOT COMPLETE 3+V*L*
3 series · 3 of 3 positions shown · non-contrast
Comparison: None.

CLINICAL DATA: Skin ulcer to the left foot

EXAM:
LEFT FOOT - COMPLETE 3+ VIEW

[foot ap]
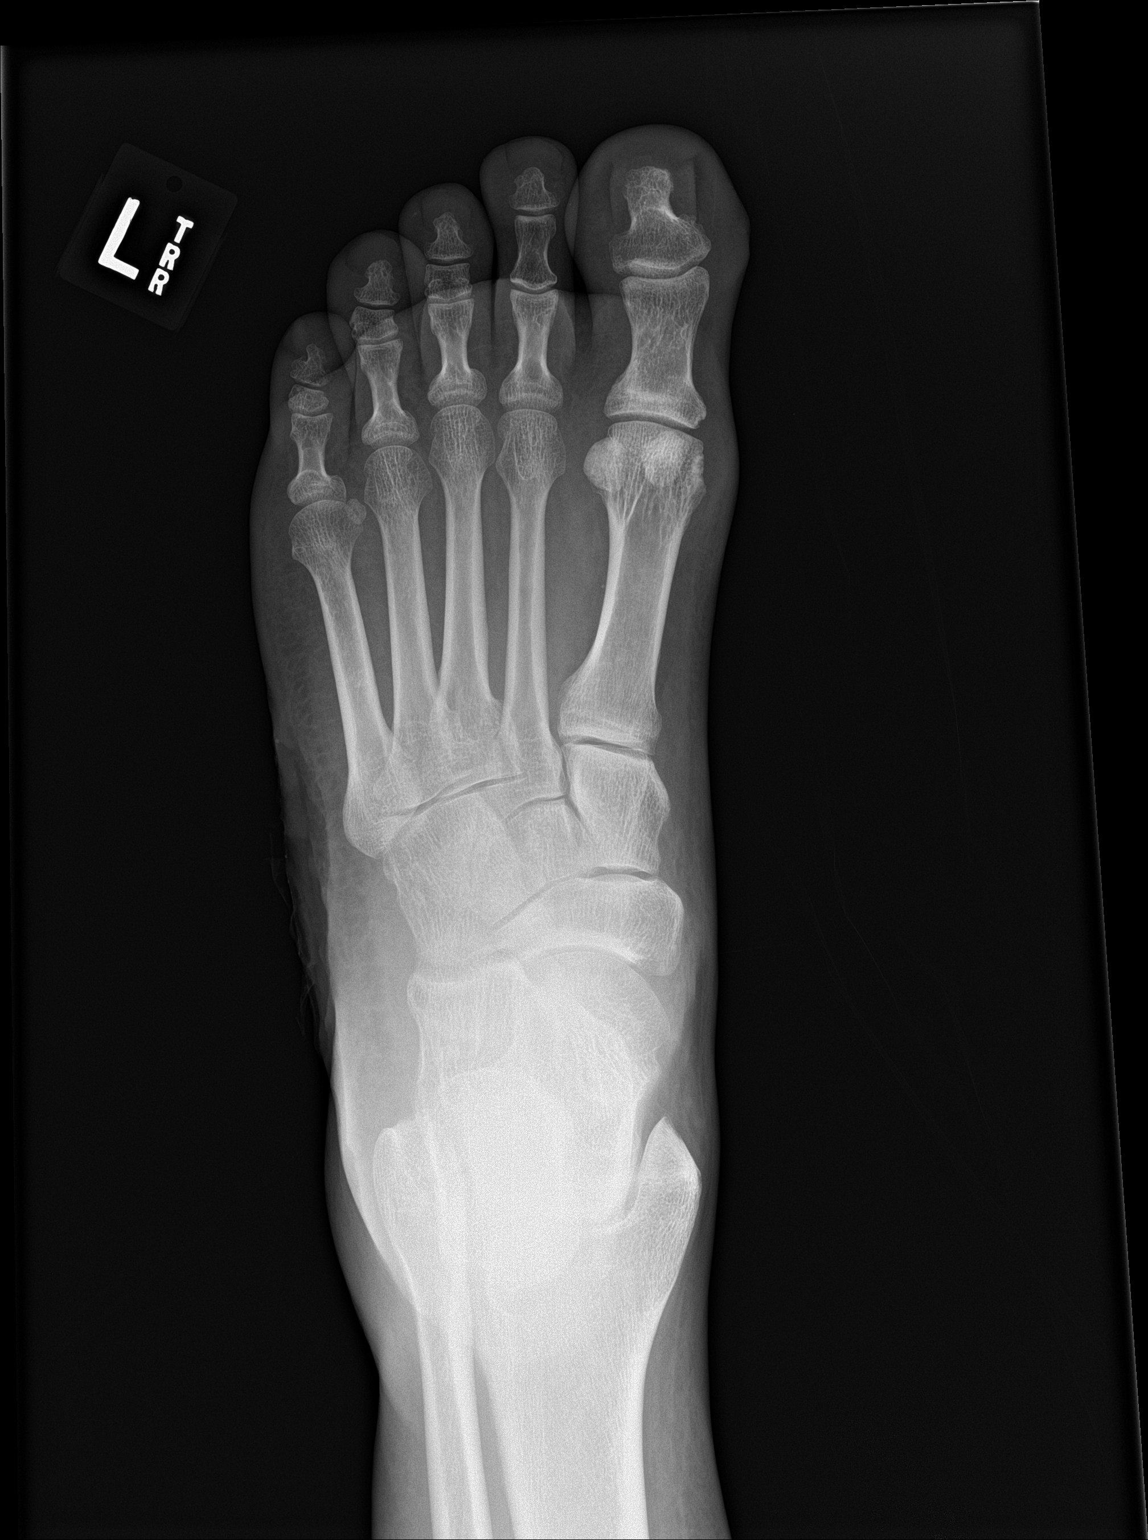

[foot obl]
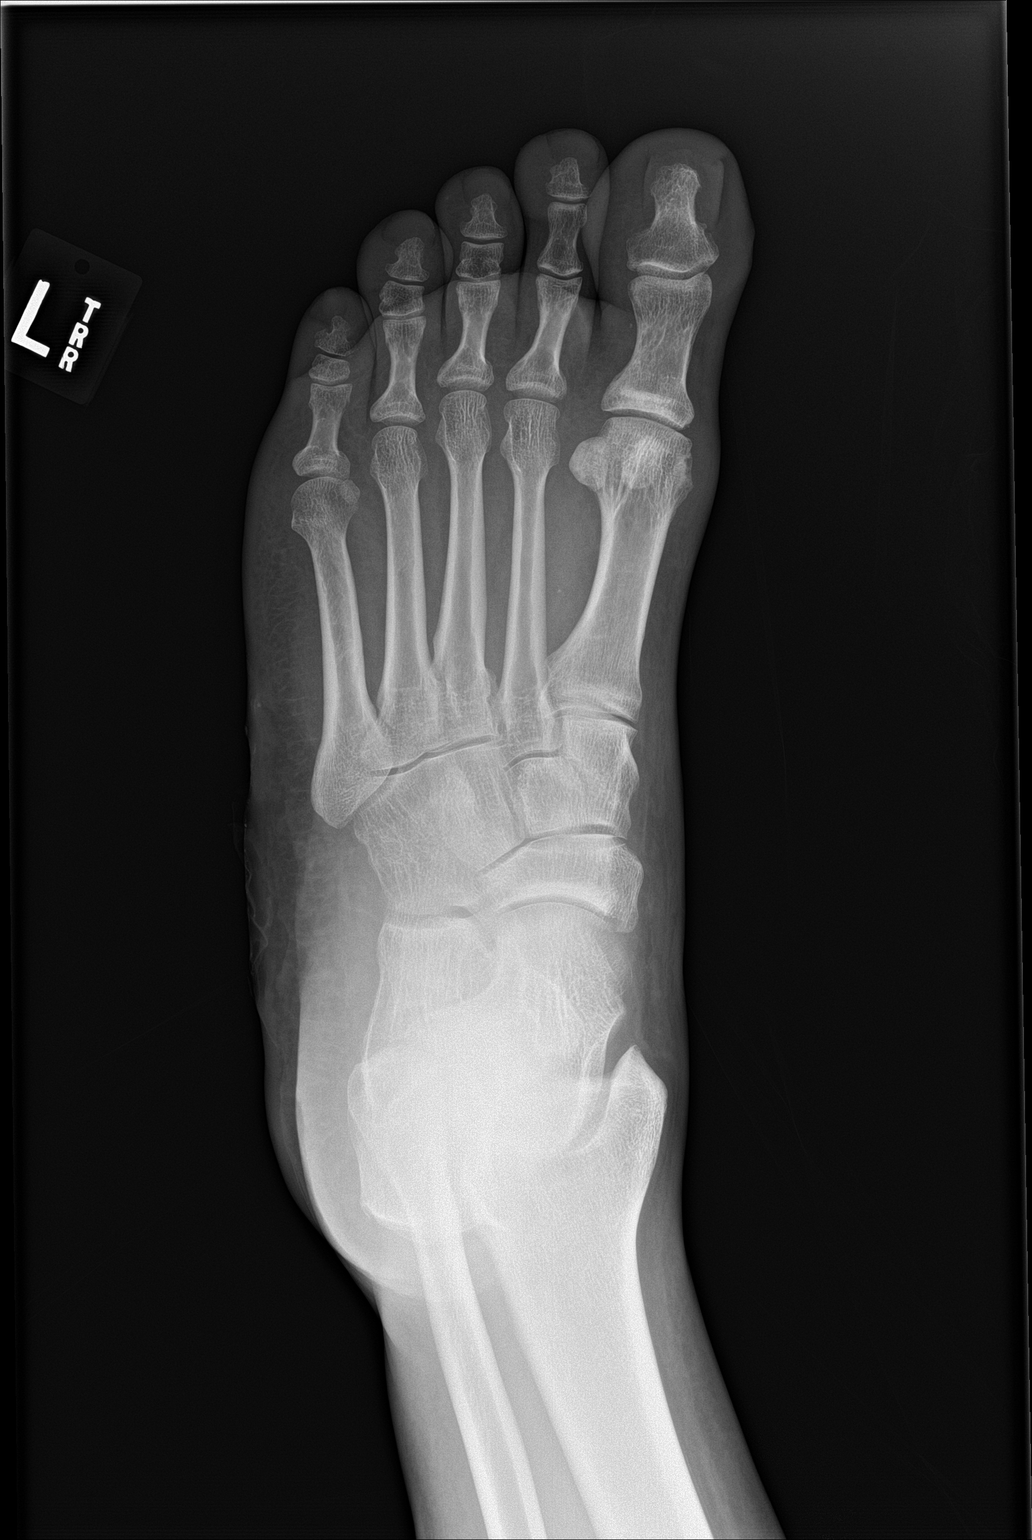

[foot lat]
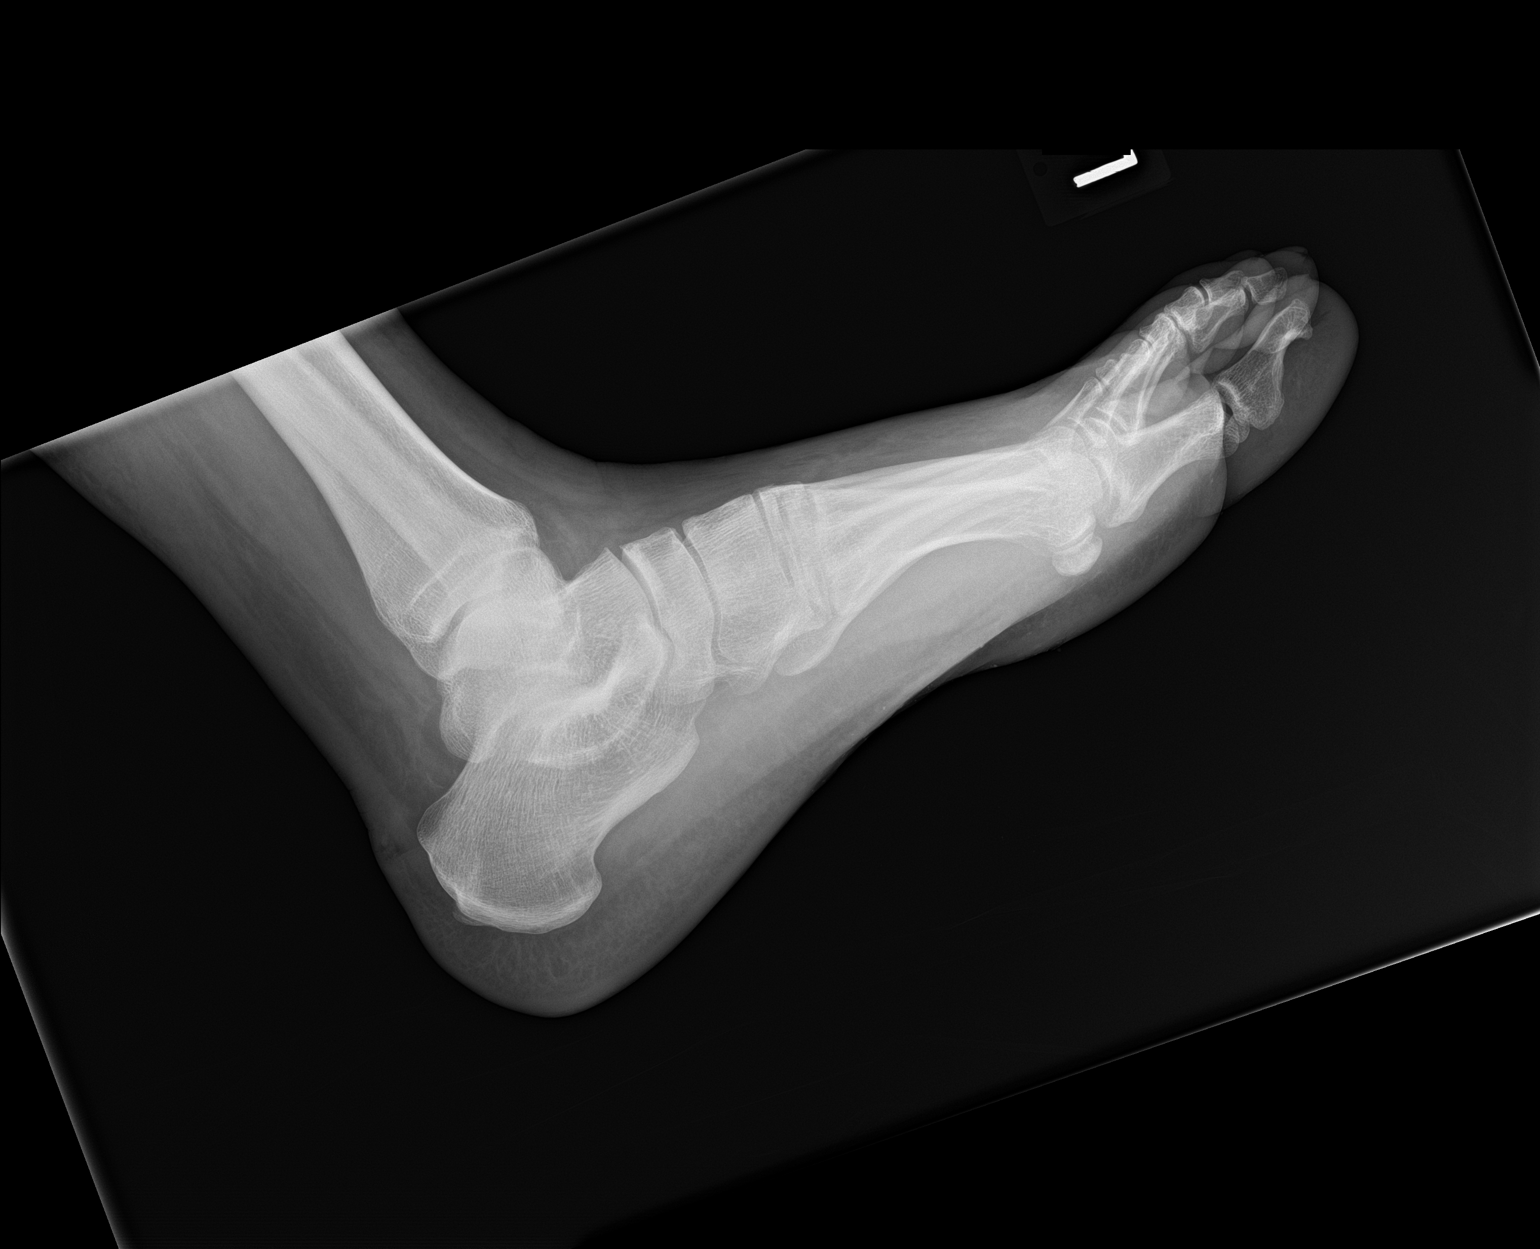

[3 of 3 positions shown; findings below may reference images not displayed]

FINDINGS: No fracture or malalignment. No soft tissue gas. No periostitis or
bone destruction. No radiopaque foreign body
IMPRESSION: No acute osseous abnormality
